# Patient Record
Sex: Female | Born: 1970 | Race: White | Hispanic: No | Marital: Married | Smoking: Never smoker
Health system: Southern US, Community
[De-identification: ages and names within clinical notes are randomized; demographics above are authoritative.]

## PROBLEM LIST (undated history)

## (undated) DIAGNOSIS — M25569 Pain in unspecified knee: Secondary | ICD-10-CM

## (undated) DIAGNOSIS — G8929 Other chronic pain: Secondary | ICD-10-CM

## (undated) DIAGNOSIS — R030 Elevated blood-pressure reading, without diagnosis of hypertension: Secondary | ICD-10-CM

## (undated) DIAGNOSIS — F419 Anxiety disorder, unspecified: Secondary | ICD-10-CM

## (undated) DIAGNOSIS — R9431 Abnormal electrocardiogram [ECG] [EKG]: Secondary | ICD-10-CM

## (undated) DIAGNOSIS — E7800 Pure hypercholesterolemia, unspecified: Secondary | ICD-10-CM

## (undated) DIAGNOSIS — I10 Essential (primary) hypertension: Secondary | ICD-10-CM

## (undated) DIAGNOSIS — R0600 Dyspnea, unspecified: Secondary | ICD-10-CM

## (undated) HISTORY — DX: Pure hypercholesterolemia, unspecified: E78.00

## (undated) HISTORY — DX: Elevated blood-pressure reading, without diagnosis of hypertension: R03.0

## (undated) HISTORY — DX: Abnormal electrocardiogram (ECG) (EKG): R94.31

## (undated) HISTORY — DX: Anxiety disorder, unspecified: F41.9

## (undated) HISTORY — PX: SPINAL CORD STIMULATOR IMPLANT: SHX2422

## (undated) HISTORY — DX: Dyspnea, unspecified: R06.00

## (undated) HISTORY — DX: Other chronic pain: G89.29

## (undated) HISTORY — DX: Pain in unspecified knee: M25.569

---

## 1997-09-16 ENCOUNTER — Inpatient Hospital Stay (HOSPITAL_COMMUNITY): Admission: RE | Admit: 1997-09-16 | Discharge: 1997-09-17 | Payer: Self-pay | Admitting: Obstetrics and Gynecology

## 1998-04-07 ENCOUNTER — Other Ambulatory Visit: Admission: RE | Admit: 1998-04-07 | Discharge: 1998-04-07 | Payer: Self-pay | Admitting: Obstetrics and Gynecology

## 1999-12-25 ENCOUNTER — Ambulatory Visit (HOSPITAL_COMMUNITY): Admission: RE | Admit: 1999-12-25 | Discharge: 1999-12-25 | Payer: Self-pay | Admitting: Orthopedic Surgery

## 1999-12-25 ENCOUNTER — Encounter: Payer: Self-pay | Admitting: Orthopedic Surgery

## 2000-01-08 ENCOUNTER — Ambulatory Visit (HOSPITAL_COMMUNITY): Admission: RE | Admit: 2000-01-08 | Discharge: 2000-01-08 | Payer: Self-pay | Admitting: Orthopedic Surgery

## 2000-01-08 ENCOUNTER — Encounter: Payer: Self-pay | Admitting: Orthopedic Surgery

## 2000-02-02 ENCOUNTER — Encounter: Payer: Self-pay | Admitting: Orthopedic Surgery

## 2000-02-02 ENCOUNTER — Ambulatory Visit (HOSPITAL_COMMUNITY): Admission: RE | Admit: 2000-02-02 | Discharge: 2000-02-02 | Payer: Self-pay | Admitting: Orthopedic Surgery

## 2000-04-15 ENCOUNTER — Other Ambulatory Visit: Admission: RE | Admit: 2000-04-15 | Discharge: 2000-04-15 | Payer: Self-pay | Admitting: Obstetrics and Gynecology

## 2003-09-13 ENCOUNTER — Other Ambulatory Visit: Admission: RE | Admit: 2003-09-13 | Discharge: 2003-09-13 | Payer: Self-pay | Admitting: Obstetrics and Gynecology

## 2004-11-20 ENCOUNTER — Other Ambulatory Visit: Admission: RE | Admit: 2004-11-20 | Discharge: 2004-11-20 | Payer: Self-pay | Admitting: Obstetrics and Gynecology

## 2005-12-06 ENCOUNTER — Ambulatory Visit: Payer: Self-pay | Admitting: Endocrinology

## 2006-01-10 ENCOUNTER — Ambulatory Visit: Payer: Self-pay | Admitting: Endocrinology

## 2006-05-06 ENCOUNTER — Ambulatory Visit: Payer: Self-pay | Admitting: "Endocrinology

## 2007-02-19 ENCOUNTER — Encounter: Payer: Self-pay | Admitting: Endocrinology

## 2007-02-19 DIAGNOSIS — F411 Generalized anxiety disorder: Secondary | ICD-10-CM | POA: Insufficient documentation

## 2007-02-20 ENCOUNTER — Ambulatory Visit: Payer: Self-pay | Admitting: Endocrinology

## 2007-10-22 ENCOUNTER — Ambulatory Visit: Payer: Self-pay | Admitting: Cardiovascular Disease

## 2007-11-04 ENCOUNTER — Ambulatory Visit: Payer: Self-pay

## 2007-11-04 ENCOUNTER — Encounter: Payer: Self-pay | Admitting: Cardiovascular Disease

## 2009-02-18 ENCOUNTER — Emergency Department (HOSPITAL_BASED_OUTPATIENT_CLINIC_OR_DEPARTMENT_OTHER): Admission: EM | Admit: 2009-02-18 | Discharge: 2009-02-18 | Payer: Self-pay | Admitting: Emergency Medicine

## 2009-02-18 ENCOUNTER — Ambulatory Visit: Payer: Self-pay | Admitting: Diagnostic Radiology

## 2010-04-10 ENCOUNTER — Encounter: Admission: RE | Admit: 2010-04-10 | Discharge: 2010-04-10 | Payer: Self-pay | Admitting: Family Medicine

## 2010-08-31 IMAGING — CR DG PELVIS 1-2V
1 series · 1 of 1 positions shown · non-contrast
Comparison: Lumbar spine radiographs obtained at the same time.

CLINICAL DATA: Left lower back pain following a fall today.

PELVIS - 1-2 VIEW

[t pelvis a.p.]
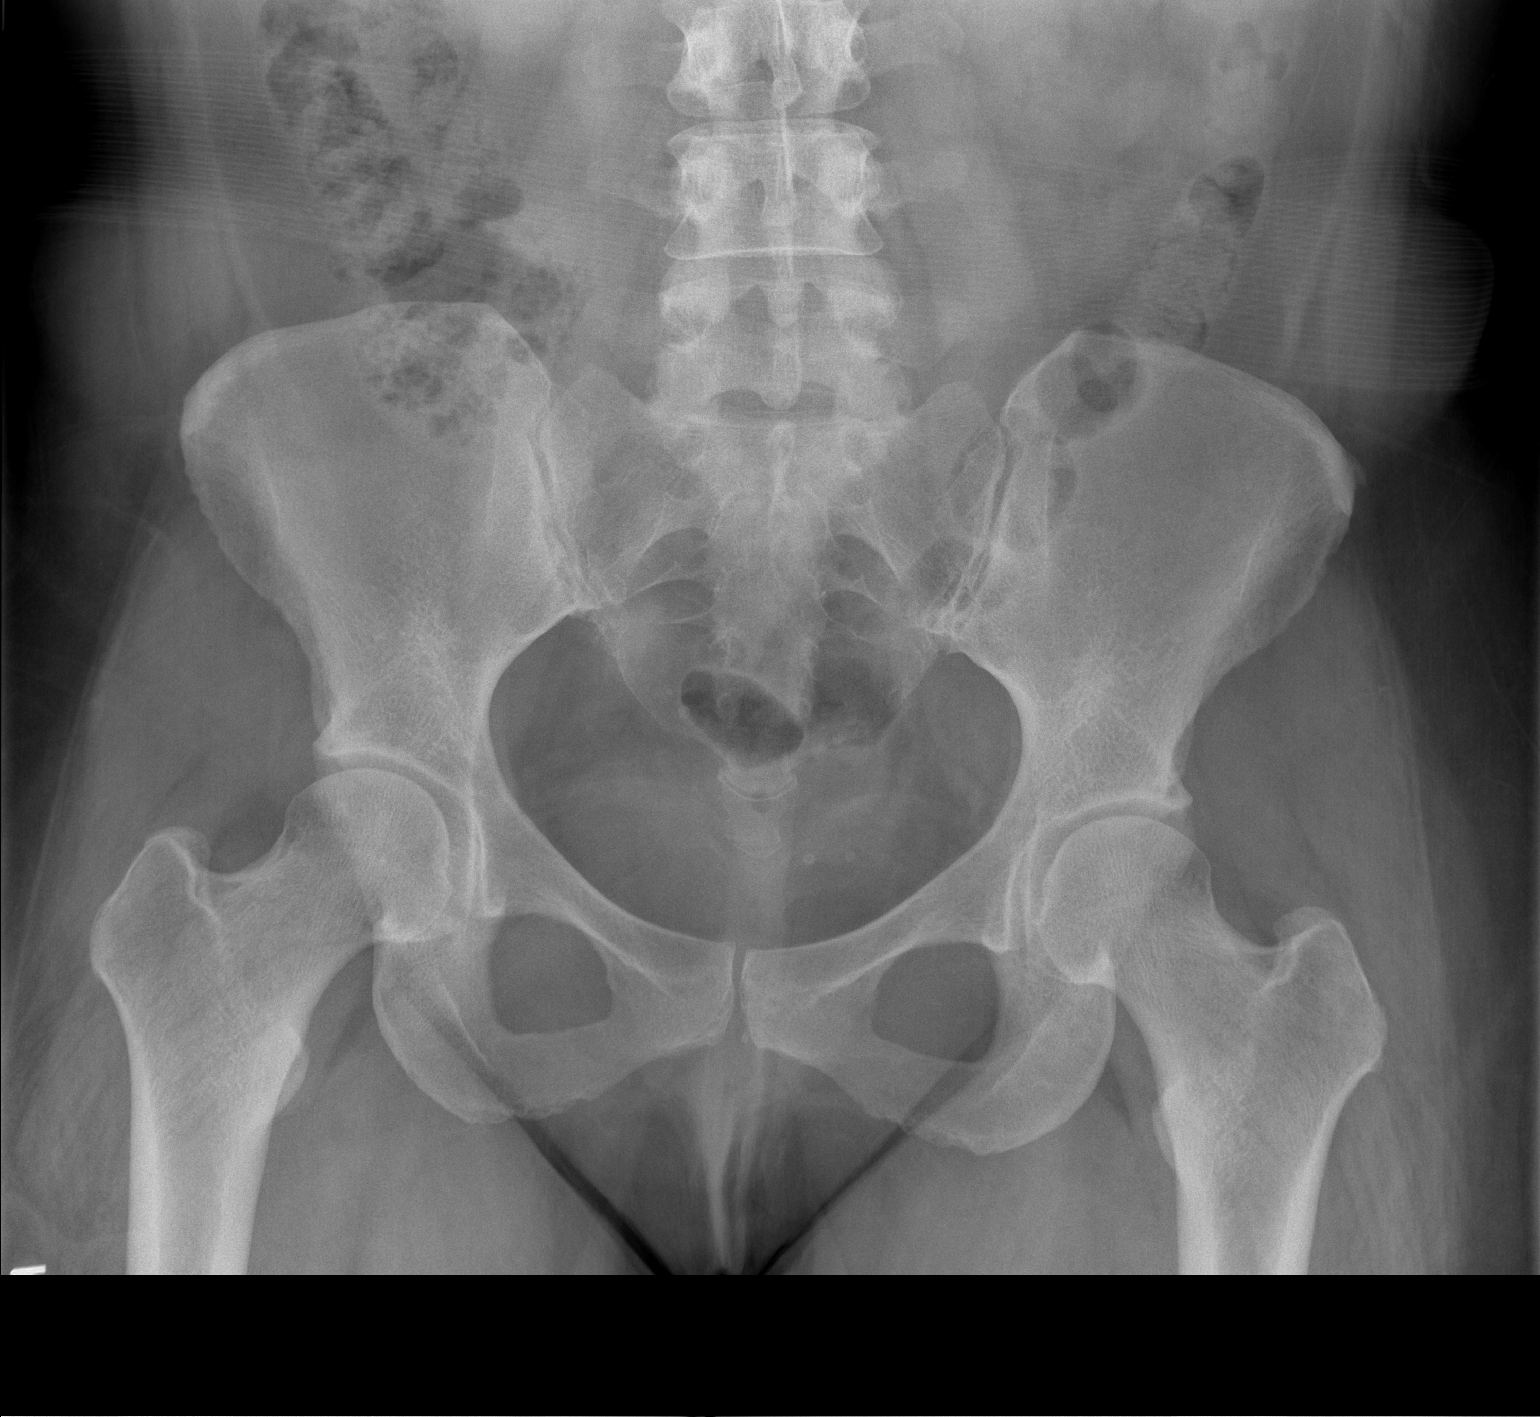

[1 of 1 positions shown; findings below may reference images not displayed]

FINDINGS: Normal appearing bones and soft tissues without fracture
or dislocation.  Two small left inferior pelvic phleboliths.
IMPRESSION: No fracture or dislocation.

## 2010-11-28 NOTE — Assessment & Plan Note (Signed)
[facility]                            CARDIOLOGY OFFICE NOTE   SHYAH, CADMUS                      MRN:          [id number]  DATE:10/22/2007                            DOB:          12-Feb-1971    Ms. Curfman is a delightful 40 year old patient referred by Dr. Collins Scotland  for dyspnea and abnormal EKG with borderline high blood pressure.   Ms. Skilton has no previous history of cardiopulmonary disease.  She  has had some allergies which are a little bit worse this time a year.  She has seen Dr. Collins Scotland recently.  The patient had low voltage on her  EKG.  There was a question of lateral T-wave inversions.  I looked at  her EKG and was not particularly impressed with the changes.  The  patient has gained 80 pounds in the last 2 years.  She is rather large-  breasted, and I suspect any EKG changes have to do with this weight  gain.   In talking to the patient, she has had dyspnea for 6 months.  There has  been no associated fever or sputum.  She has occasional cough.  There  has been no PND or orthopnea.  She has had some dependent edema.  There  is no history of PE or DVT.   The patient has not had a previous cardiac workup.   She has hyperlipidemia, borderline hypertension.  She is a nonsmoker.   FAMILY HISTORY:  Negative coronary artery disease and she is a  nondiabetic.   She does have hypercholesterolemia on therapy.   The patient does seem a little bit depressed.  She was fairly emotional  in our encounter.  She was recently started on Wellbutrin by Dr. Kerri Perches PA, Vincenza Hews, and I think this is appropriate.   PAST MEDICAL HISTORY:  Remarkable for tummy tuck, shoulder scope, and  hysterectomy.  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  She is married.  She  has 2 step children and 2 children of her own, all of them are  teenagers.  She is allergic to PENICILLIN.  She is on  Wellbutrin 100 mg,  cephradine, Xanax, hydrochlorothiazide 25 a day, Crestor 20 a day.  The  patient's family history is negative.  Mother and father alive at ages  55 and 70.   EXAM:  Remarkable for an overweight white female in no distress.  Blood pressure is 140/80, pulse is 80 and regular, afebrile, respiratory  rate 14.  HEENT:  Unremarkable.  Carotids are normal without bruit, no lymphadenopathy, thyromegaly, JVP  elevation.  LUNGS:  Clear diaphragmatic motion.  No wheezing.  S1-S2 distant heart sounds.  PMI not palpable.  ABDOMEN:  Protuberant.  Bowel sounds positive.  No hepatosplenomegaly.  No hepatojugular reflux.  No bruit, no tenderness.  Distal pulses intact with trace edema.  NEURO:  Nonfocal.  SKIN:  Warm and dry.  No muscular weakness.   Notes are reviewed from [facility].  Lab work was remarkable for  potassium 3.7, BUN 14,  creatinine 0.8.  EKG is reviewed.   IMPRESSION:  1. Abnormal EKG with low voltage rule out pericardial effusion.  Check      2-D echocardiogram.  2. Dyspnea, likely functional due to weight gain.  Check 2-D      echocardiogram to rule out cardiomyopathy.  Stress Myoview to      assess exercise capacity.  Rule out chronotropic incompetence of      coronary disease.  3. Weight gain.  Follow up with Dr. Collins Scotland.  Needs to go back on an      exercise program and see a nutritionist.  In talking to the      patient, she has complaints of fatigue and malaise.  She would      appear to have depression and I think Wellbutrin is appropriate for      her.  She will follow up with Dr. Collins Scotland for further therapy of this      which may be the root cause of her problems.   Further recommendations based on results of her stress test and echo,  but I suspect she will be able to be seen on a p.r.n. basis.     Noralyn Pick. Eden Emms, MD, [facility]  Electronically Signed    PCN/MedQ  DD: 10/22/2007  DT: 10/22/2007  Job #: [id number]

## 2011-07-16 ENCOUNTER — Encounter: Payer: Self-pay | Admitting: Cardiovascular Disease

## 2011-07-18 ENCOUNTER — Ambulatory Visit (INDEPENDENT_AMBULATORY_CARE_PROVIDER_SITE_OTHER): Payer: BC Managed Care – PPO | Admitting: Cardiovascular Disease

## 2011-07-18 ENCOUNTER — Encounter: Payer: Self-pay | Admitting: Cardiovascular Disease

## 2011-07-18 DIAGNOSIS — E669 Obesity, unspecified: Secondary | ICD-10-CM

## 2011-07-18 DIAGNOSIS — R634 Abnormal weight loss: Secondary | ICD-10-CM

## 2011-07-18 DIAGNOSIS — F411 Generalized anxiety disorder: Secondary | ICD-10-CM

## 2011-07-18 DIAGNOSIS — R079 Chest pain, unspecified: Secondary | ICD-10-CM | POA: Insufficient documentation

## 2011-07-18 DIAGNOSIS — R9431 Abnormal electrocardiogram [ECG] [EKG]: Secondary | ICD-10-CM

## 2011-07-18 NOTE — Assessment & Plan Note (Signed)
Discussed low carb diet.  If she passes her stress test will start exercising.  Otherwise would be a candidate for bariatric surgery

## 2011-07-18 NOTE — Assessment & Plan Note (Signed)
Stable F/U Dr Cyndia Bent

## 2011-07-18 NOTE — Patient Instructions (Signed)
Continue current medications as listed.  Your physician has requested that you have a stress echocardiogram. For further information please visit [email or web address] Please follow instruction sheet as given.  You have been referred for a nutritional consult.  Follow up in 1 year with Dr Eden Emms.  You will receive a letter in the mail 2 months before you are due.  Please call us when you receive this letter to schedule your follow up appointment.

## 2011-07-18 NOTE — Progress Notes (Signed)
Referred by DR Cyndia Bent.  Previously had Dr Collins Scotland as primary.  She sent her to see [place] in 2009 for abnormal ECG and dyspnea.  Echo.  Reviewed echo from 11/04/2007 and normal with EF 60%  Continues be overweight, sedentary and poor eating habits.  Has had SSCP or tightness in chest when she is stressed.  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  Has 4 kids age 41-24 at home.  Tightness in chest radiates to right arm  ROS: Denies fever, malais, weight loss, blurry vision, decreased visual acuity, cough, sputum, SOB, hemoptysis, pleuritic pain, palpitaitons, heartburn, abdominal pain, melena, lower extremity edema, claudication, or rash.  All other systems reviewed and negative   General: Affect appropriate Obese HEENT: normal Neck supple with no adenopathy JVP normal no bruits no thyromegaly Lungs clear with no wheezing and good diaphragmatic motion Heart:  S1/S2 no murmur,rub, gallop or click PMI normal Abdomen: benighn, BS positve, no tenderness, no AAA no bruit.  No HSM or HJR Distal pulses intact with no bruits No edema Neuro non-focal Skin warm and dry No muscular weakness  Medications Current Outpatient Prescriptions  Medication Sig Dispense Refill  . ALPRAZolam (XANAX) 0.5 MG tablet Take 0.5 mg by mouth at bedtime as needed.        . DULoxetine (CYMBALTA) 60 MG capsule Take 60 mg by mouth daily.          Allergies Penicillins  Family History: No family history on file.  Social History: History   Social History  . Marital Status: Married    Spouse Name: N/A    Number of Children: N/A  . Years of Education: N/A   Occupational History  . Not on file.   Social History Main Topics  . Smoking status: Never Smoker   . Smokeless tobacco: Never Used  . Alcohol Use: Yes  . Drug Use: No  . Sexually Active: Not on file   Other Topics Concern  . Not on file   Social History Narrative  . No narrative on file    Electrocardiogram:  NSR low  voltage from obesity. Q in lead 3  Assessment and Plan

## 2011-07-18 NOTE — Assessment & Plan Note (Signed)
No change from 2009  When she had a normal echo.  Likely form body habitus

## 2011-07-18 NOTE — Assessment & Plan Note (Signed)
Atypical but obese and needs to start exercise program.  Stress echo

## 2011-07-19 ENCOUNTER — Telehealth: Payer: Self-pay | Admitting: Cardiovascular Disease

## 2011-07-19 NOTE — Telephone Encounter (Signed)
New msg Pt was returning your call please call at this number

## 2011-07-19 NOTE — Telephone Encounter (Signed)
SHARON CALLED PT RE MAKING APPT WITH DIETICIAN CALL TRASFERRED .Zack Seal

## 2011-07-23 ENCOUNTER — Telehealth: Payer: Self-pay | Admitting: Cardiovascular Disease

## 2011-07-23 NOTE — Telephone Encounter (Signed)
New msg Pt has sinus infection and congestion and wants to know if she should come to appt tomorrow. Please call

## 2011-07-23 NOTE — Telephone Encounter (Signed)
SPOKE WITH PT HAS CONCERNS  RE PROCEEDING WITH STRESS ECHO  IS COUGHING A LOT WHEN GETS HOT  ALSO NOT FEELING  WELL, INSTRUCTED PT TO RESCHEDULE  TEST. PT VERBALIZED UNDERSTANDING .Zack Seal

## 2011-07-25 ENCOUNTER — Other Ambulatory Visit (HOSPITAL_COMMUNITY): Payer: BC Managed Care – PPO | Admitting: Radiology

## 2011-08-01 ENCOUNTER — Ambulatory Visit (HOSPITAL_COMMUNITY): Payer: BC Managed Care – PPO | Attending: Cardiovascular Disease | Admitting: Radiology

## 2011-08-01 ENCOUNTER — Other Ambulatory Visit (HOSPITAL_COMMUNITY): Payer: Self-pay | Admitting: Cardiology

## 2011-08-01 DIAGNOSIS — R072 Precordial pain: Secondary | ICD-10-CM

## 2011-08-01 DIAGNOSIS — E785 Hyperlipidemia, unspecified: Secondary | ICD-10-CM | POA: Insufficient documentation

## 2011-08-01 DIAGNOSIS — R0989 Other specified symptoms and signs involving the circulatory and respiratory systems: Secondary | ICD-10-CM

## 2011-08-01 DIAGNOSIS — Z8249 Family history of ischemic heart disease and other diseases of the circulatory system: Secondary | ICD-10-CM | POA: Insufficient documentation

## 2011-08-01 DIAGNOSIS — I10 Essential (primary) hypertension: Secondary | ICD-10-CM | POA: Insufficient documentation

## 2011-08-01 DIAGNOSIS — R079 Chest pain, unspecified: Secondary | ICD-10-CM | POA: Insufficient documentation

## 2011-08-01 MED ORDER — PERFLUTREN PROTEIN A MICROSPH IV SUSP
3.0000 mL | Freq: Once | INTRAVENOUS | Status: AC
  Administered 2011-08-01: 3 mL via INTRAVENOUS

## 2011-08-03 ENCOUNTER — Telehealth: Payer: Self-pay | Admitting: *Deleted

## 2011-08-03 DIAGNOSIS — I10 Essential (primary) hypertension: Secondary | ICD-10-CM

## 2011-08-03 NOTE — Telephone Encounter (Signed)
Pt aware of stress test results and questioned if dr Eden Emms was planning on putting her on a bp med. Will forward for dr Eden Emms review

## 2011-08-09 NOTE — Telephone Encounter (Signed)
Yes  Cozaar 50mg  and F/U BMET in 8 weeks

## 2011-08-10 MED ORDER — LOSARTAN POTASSIUM 50 MG PO TABS: 50.0000 mg | ORAL_TABLET | Freq: Every day | ORAL | Status: AC

## 2011-08-10 NOTE — Telephone Encounter (Signed)
Spoke with pt, aware of new med and need for repeat labs

## 2011-10-02 ENCOUNTER — Other Ambulatory Visit: Payer: BC Managed Care – PPO

## 2012-03-11 ENCOUNTER — Other Ambulatory Visit: Payer: Self-pay | Admitting: Family Medicine

## 2012-03-11 DIAGNOSIS — Z1231 Encounter for screening mammogram for malignant neoplasm of breast: Secondary | ICD-10-CM

## 2012-03-21 ENCOUNTER — Ambulatory Visit: Payer: BC Managed Care – PPO

## 2012-05-12 ENCOUNTER — Ambulatory Visit
Admission: RE | Admit: 2012-05-12 | Discharge: 2012-05-12 | Disposition: A | Discharge status: Home / Self Care | Payer: BC Managed Care – PPO | Source: Ambulatory Visit | Attending: Family Medicine | Admitting: Family Medicine

## 2012-05-12 DIAGNOSIS — Z1231 Encounter for screening mammogram for malignant neoplasm of breast: Secondary | ICD-10-CM

## 2013-09-28 ENCOUNTER — Other Ambulatory Visit: Payer: Self-pay

## 2013-09-28 DIAGNOSIS — Z1231 Encounter for screening mammogram for malignant neoplasm of breast: Secondary | ICD-10-CM

## 2013-10-12 ENCOUNTER — Ambulatory Visit
Admission: RE | Admit: 2013-10-12 | Discharge: 2013-10-12 | Disposition: A | Discharge status: Home / Self Care | Payer: BC Managed Care – PPO | Source: Ambulatory Visit

## 2013-10-12 DIAGNOSIS — Z1231 Encounter for screening mammogram for malignant neoplasm of breast: Secondary | ICD-10-CM

## 2015-05-03 ENCOUNTER — Other Ambulatory Visit: Payer: Self-pay

## 2015-05-03 DIAGNOSIS — Z1231 Encounter for screening mammogram for malignant neoplasm of breast: Secondary | ICD-10-CM

## 2015-05-19 ENCOUNTER — Ambulatory Visit
Admission: RE | Admit: 2015-05-19 | Discharge: 2015-05-19 | Disposition: A | Discharge status: Home / Self Care | Payer: BC Managed Care – PPO | Source: Ambulatory Visit

## 2015-05-19 DIAGNOSIS — Z1231 Encounter for screening mammogram for malignant neoplasm of breast: Secondary | ICD-10-CM

## 2016-07-24 ENCOUNTER — Other Ambulatory Visit: Payer: Self-pay | Admitting: Family Medicine

## 2016-07-24 DIAGNOSIS — Z1231 Encounter for screening mammogram for malignant neoplasm of breast: Secondary | ICD-10-CM

## 2016-08-16 ENCOUNTER — Ambulatory Visit
Admission: RE | Admit: 2016-08-16 | Discharge: 2016-08-16 | Disposition: A | Discharge status: Home / Self Care | Payer: BC Managed Care – PPO | Source: Ambulatory Visit | Attending: Family Medicine | Admitting: Family Medicine

## 2016-08-16 DIAGNOSIS — Z1231 Encounter for screening mammogram for malignant neoplasm of breast: Secondary | ICD-10-CM

## 2018-02-26 IMAGING — MG 2D DIGITAL SCREENING BILATERAL MAMMOGRAM WITH CAD AND ADJUNCT TO
8 of 12 series · 8 of 28 positions shown · non-contrast
Comparison: Previous exam(s).

CLINICAL DATA: Screening.

EXAM:
2D DIGITAL SCREENING BILATERAL MAMMOGRAM WITH CAD AND ADJUNCT TOMO

[L MLO]
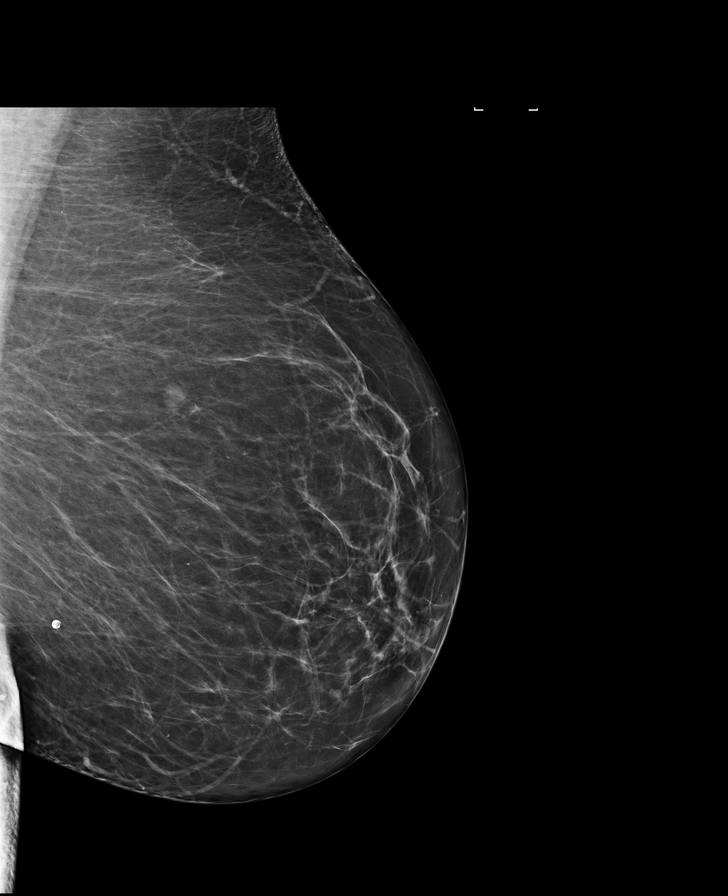

[L CC]
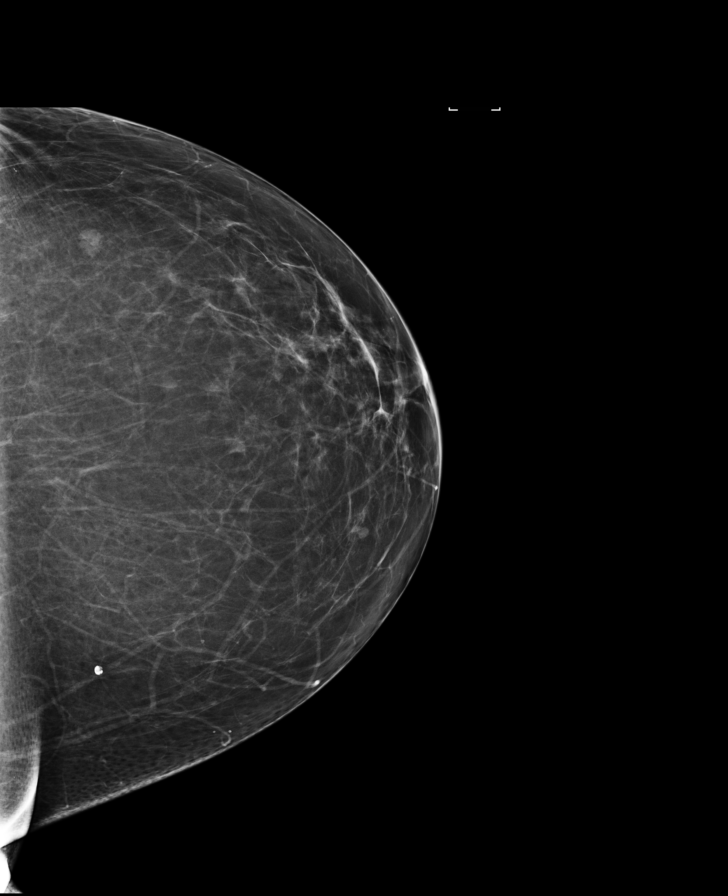

[R MLO synth-2D]
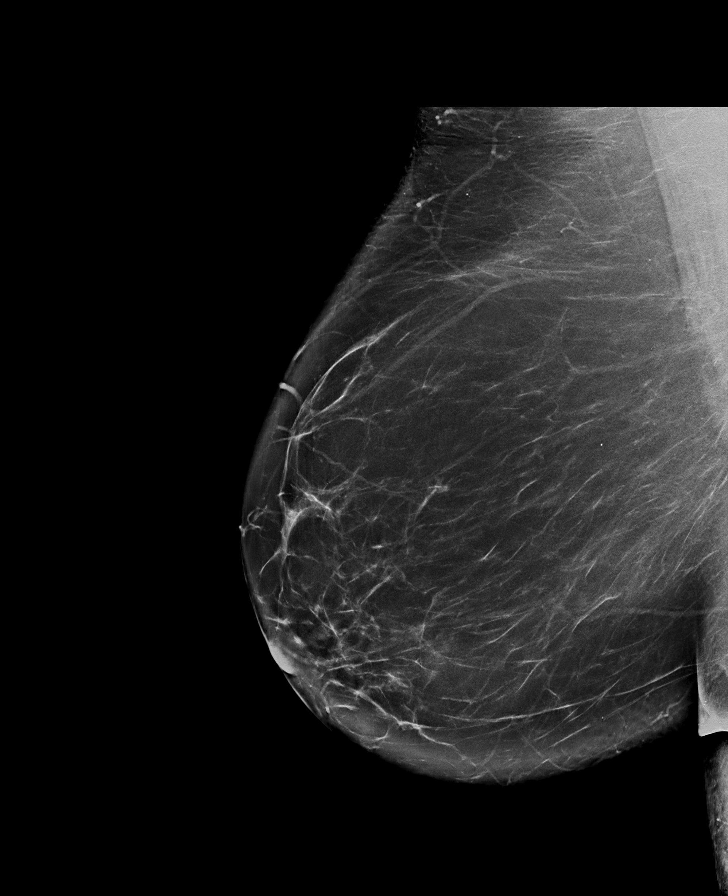

[R CC]
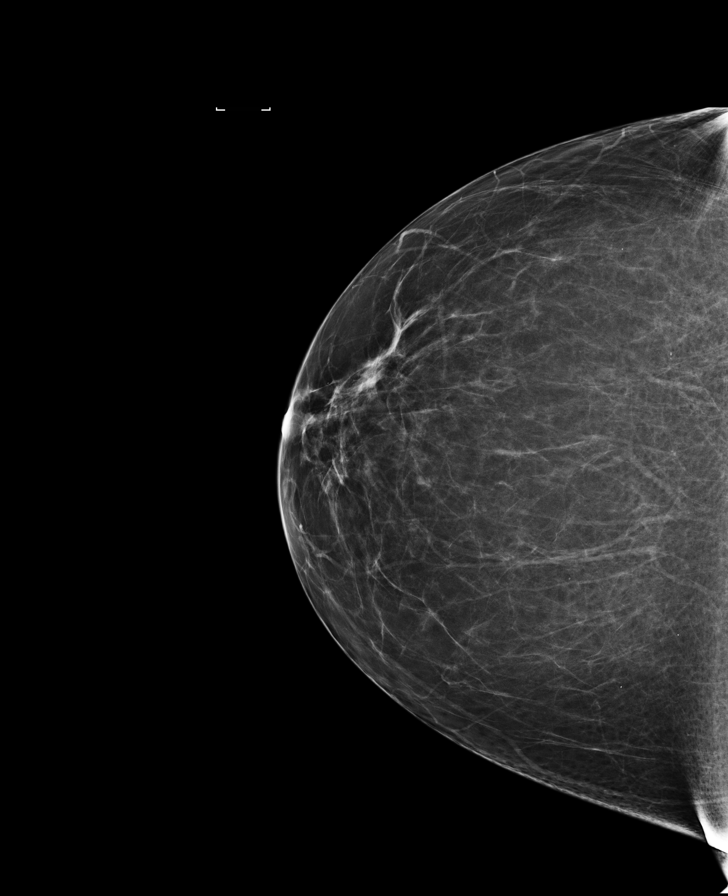

[L MLO synth-2D]
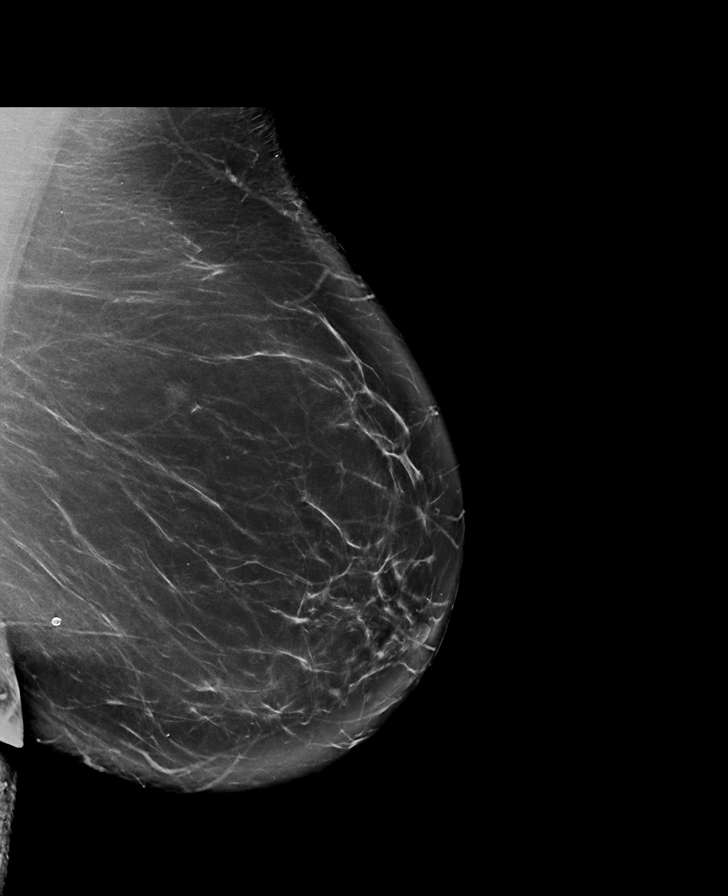

[L CC synth-2D]
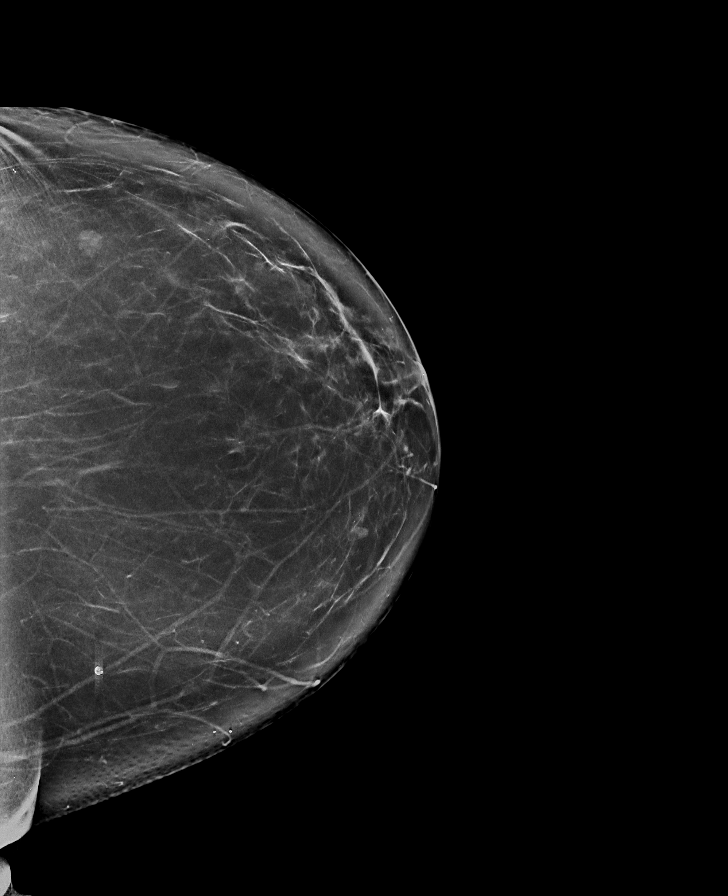

[R MLO]
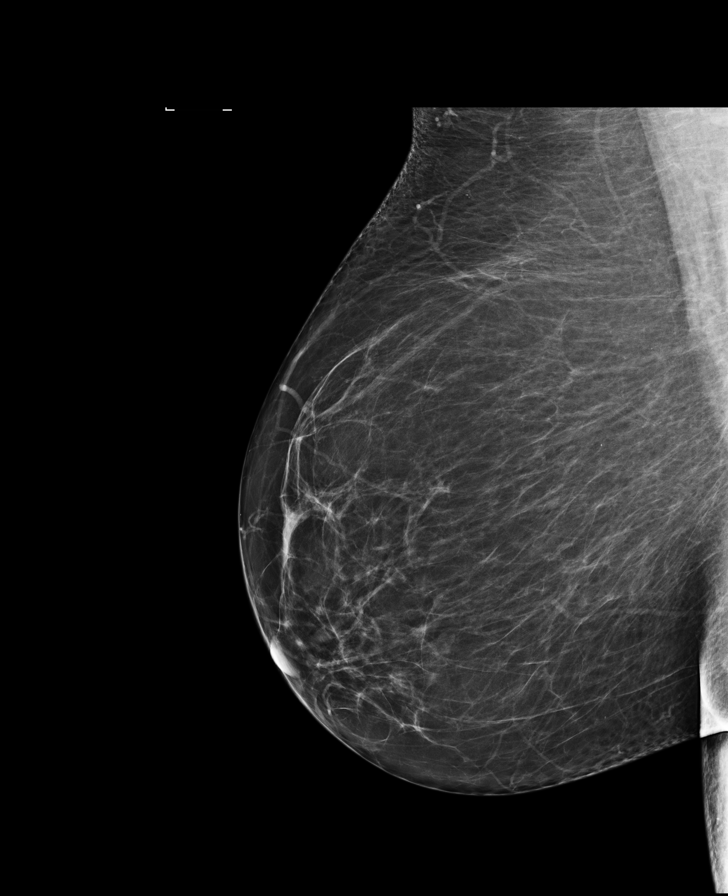

[R CC synth-2D]
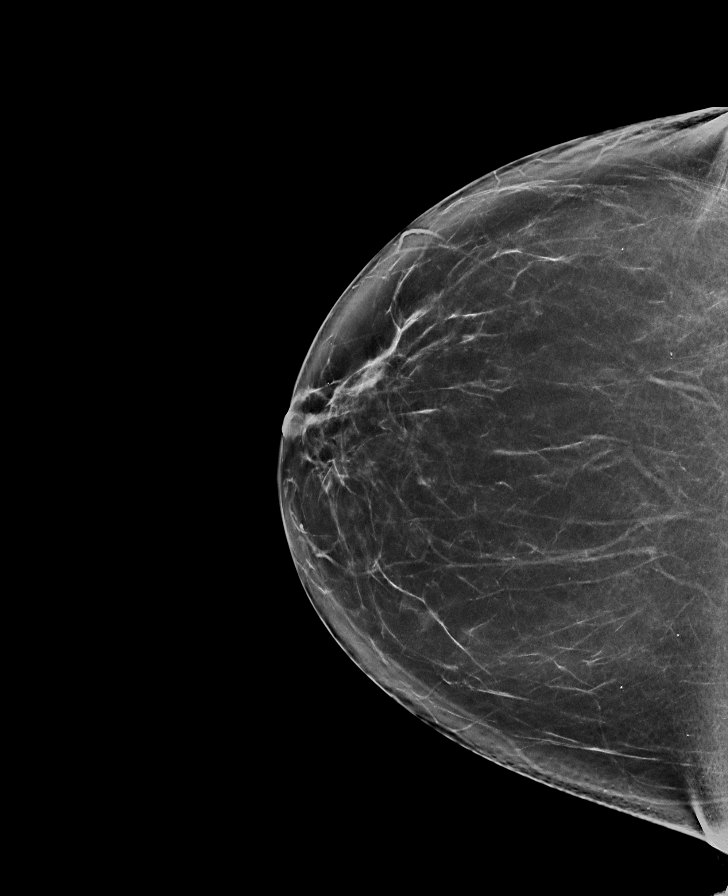

[8 of 28 positions shown; findings below may reference images not displayed]

ACR Breast Density Category b: There are scattered areas of
fibroglandular density.
FINDINGS: There are no findings suspicious for malignancy. Images were
processed with CAD.
IMPRESSION: No mammographic evidence of malignancy. A result letter of this
screening mammogram will be mailed directly to the patient.

RECOMMENDATION:
Screening mammogram in one year. (Code:97-6-RS4)

BI-RADS CATEGORY  1: Negative.

## 2018-04-24 HISTORY — PX: LUMBAR FUSION: SHX111

## 2018-05-16 HISTORY — PX: CERVICAL DISCECTOMY: SHX98

## 2021-08-23 ENCOUNTER — Encounter (HOSPITAL_BASED_OUTPATIENT_CLINIC_OR_DEPARTMENT_OTHER): Payer: Self-pay | Admitting: Emergency Medicine

## 2021-08-23 ENCOUNTER — Other Ambulatory Visit: Payer: Self-pay

## 2021-08-23 ENCOUNTER — Emergency Department (HOSPITAL_BASED_OUTPATIENT_CLINIC_OR_DEPARTMENT_OTHER)
Admission: EM | Admit: 2021-08-23 | Discharge: 2021-08-23 | Disposition: A | Discharge status: Home / Self Care | Payer: No Typology Code available for payment source | Attending: Emergency Medicine | Admitting: Emergency Medicine

## 2021-08-23 DIAGNOSIS — Z23 Encounter for immunization: Secondary | ICD-10-CM | POA: Insufficient documentation

## 2021-08-23 DIAGNOSIS — Z79899 Other long term (current) drug therapy: Secondary | ICD-10-CM | POA: Insufficient documentation

## 2021-08-23 DIAGNOSIS — S51851A Open bite of right forearm, initial encounter: Secondary | ICD-10-CM | POA: Insufficient documentation

## 2021-08-23 DIAGNOSIS — W540XXA Bitten by dog, initial encounter: Secondary | ICD-10-CM | POA: Diagnosis not present

## 2021-08-23 DIAGNOSIS — S61052A Open bite of left thumb without damage to nail, initial encounter: Secondary | ICD-10-CM | POA: Diagnosis not present

## 2021-08-23 MED ORDER — HYDROCODONE-ACETAMINOPHEN 5-325 MG PO TABS: 1.0000 | ORAL_TABLET | Freq: Four times a day (QID) | ORAL | 0 refills | Status: AC | PRN

## 2021-08-23 MED ORDER — DOXYCYCLINE HYCLATE 100 MG PO TABS
100.0000 mg | ORAL_TABLET | Freq: Once | ORAL | Status: AC
  Administered 2021-08-23: 100 mg via ORAL
  Filled 2021-08-23: qty 1

## 2021-08-23 MED ORDER — DOXYCYCLINE HYCLATE 100 MG PO CAPS
100.0000 mg | ORAL_CAPSULE | Freq: Two times a day (BID) | ORAL | 0 refills | Status: AC
Start: 2021-08-23 — End: ?

## 2021-08-23 MED ORDER — TETANUS-DIPHTH-ACELL PERTUSSIS 5-2.5-18.5 LF-MCG/0.5 IM SUSY
0.5000 mL | PREFILLED_SYRINGE | Freq: Once | INTRAMUSCULAR | Status: AC
Start: 2021-08-23 — End: 2021-08-23
  Administered 2021-08-23: 0.5 mL via INTRAMUSCULAR
  Filled 2021-08-23: qty 0.5

## 2021-08-23 MED ORDER — METRONIDAZOLE 500 MG PO TABS
500.0000 mg | ORAL_TABLET | Freq: Once | ORAL | Status: AC
  Administered 2021-08-23: 500 mg via ORAL
  Filled 2021-08-23: qty 1

## 2021-08-23 MED ORDER — LIDOCAINE-EPINEPHRINE 2 %-1:200000 IJ SOLN
20.0000 mL | Freq: Once | INTRAMUSCULAR | Status: AC
Start: 2021-08-23 — End: 2021-08-23
  Administered 2021-08-23: 20 mL via INTRADERMAL
  Filled 2021-08-23: qty 20

## 2021-08-23 MED ORDER — METRONIDAZOLE 500 MG PO TABS: 500.0000 mg | ORAL_TABLET | Freq: Two times a day (BID) | ORAL | 0 refills | Status: AC

## 2021-08-23 MED ORDER — OXYCODONE-ACETAMINOPHEN 5-325 MG PO TABS
1.0000 | ORAL_TABLET | Freq: Once | ORAL | Status: AC
  Administered 2021-08-23: 1 via ORAL
  Filled 2021-08-23: qty 1

## 2021-08-23 NOTE — Discharge Instructions (Addendum)
You have been evaluated for most recent dog bite injuries.  Please follow instruction below for wound care.  Take antibiotic as prescribed.  Reach out to your surgeon to determine whether you would benefit from rabies series.  Call and follow-up closely with hand specialist for further care.  Return if you have any concerns

## 2021-08-23 NOTE — ED Provider Notes (Signed)
[facility] EMERGENCY DEPT Provider Note   CSN: [id number] Arrival date & time: 08/23/21  1124     History  Chief Complaint  Patient presents with   Animal Bite    Robin Lawson is a 51 y.o. female.  The history is provided by the patient. No language interpreter was used.  Animal Bite  51 year old female with significant history of anxiety, who presents evaluation of dog bite.  Patient reports earlier today she was dog sitting her dog, and her son's dog.  She mention her son's dog kicked her dog and she attempted to separate the dog bite in the process she fell in between both dog and was bitten in the right forearm and the left thumb.  States that the dog bite in her left thumb was from her dog, and the dog bite in the right forearm was from her son's dog.  She is unsure her last tetanus status.  Her son's dog is not up-to-date with rabies.  She is currently complaining of sharp throbbing pain about the forearm and rates as 5 out of 10 without any associated numbness.  She also is scheduled to have a gastric bypass in 2 weeks.  She denies any specific treatment tried prior to arrival.  Home Medications Prior to Admission medications   Medication Sig Start Date End Date Taking? Authorizing Provider  ALPRAZolam Prudy Feeler) 0.5 MG tablet Take 0.5 mg by mouth at bedtime as needed.      [provider]  DULoxetine (CYMBALTA) 60 MG capsule Take 60 mg by mouth daily.      [provider]  losartan (COZAAR) 50 MG tablet Take 1 tablet (50 mg total) by mouth daily. 08/10/11 08/09/12  Wendall Stade, MD      Allergies    Penicillins    Review of Systems   Review of Systems  All other systems reviewed and are negative.  Physical Exam Updated Vital Signs BP (!) 127/110 (BP Location: Left Arm)    Pulse (!) 108    Temp 98.5 F (36.9 C)    Resp 18    Wt 109.8 kg    SpO2 99%  Physical Exam Vitals and nursing note reviewed.  Constitutional:      General: She is  not in acute distress.    Appearance: She is well-developed.  HENT:     Head: Atraumatic.  Eyes:     Conjunctiva/sclera: Conjunctivae normal.  Pulmonary:     Effort: Pulmonary effort is normal.  Musculoskeletal:        General: Signs of injury (Large skin avulsion noted to right forearm and small skin avulsion noted to left thumb.  Please refer to picture below.) present.     Cervical back: Neck supple.  Skin:    Findings: No rash.  Neurological:     Mental Status: She is alert.  Psychiatric:        Mood and Affect: Mood normal.        ED Results / Procedures / Treatments   Labs (all labs ordered are listed, but only abnormal results are displayed) Labs Reviewed - No data to display  EKG None  Radiology No results found.  Procedures .Marland KitchenLaceration Repair  Date/Time: 08/23/2021 2:39 PM Performed by: Fayrene Helper, PA-C Authorized by: Fayrene Helper, PA-C   Consent:    Consent obtained:  Verbal   Consent given by:  Patient   Risks discussed:  Infection, need for additional repair, pain, poor cosmetic result and poor wound  healing   Alternatives discussed:  No treatment and delayed treatment Universal protocol:    Procedure explained and questions answered to patient or proxy's satisfaction: yes     Relevant documents present and verified: yes     Test results available: yes     Imaging studies available: yes     Required blood products, implants, devices, and special equipment available: yes     Site/side marked: yes     Immediately prior to procedure, a time out was called: yes     Patient identity confirmed:  Verbally with patient Anesthesia:    Anesthesia method:  Local infiltration   Local anesthetic:  Lidocaine 2% WITH epi Laceration details:    Location:  Shoulder/arm   Shoulder/arm location:  R lower arm   Length (cm):  5   Depth (mm):  6 Pre-procedure details:    Preparation:  Patient was prepped and draped in usual sterile fashion Exploration:     Limited defect created (wound extended): yes     Hemostasis achieved with:  Epinephrine   Imaging outcome: foreign body not noted     Wound exploration: wound explored through full range of motion and entire depth of wound visualized     Wound extent: muscle damage     Wound extent: no underlying fracture noted and no vascular damage noted     Contaminated: no   Treatment:    Area cleansed with:  Povidone-iodine and saline   Amount of cleaning:  Extensive   Irrigation solution:  Sterile saline   Irrigation method:  Pressure wash   Visualized foreign bodies/material removed: no     Debridement:  None   Layers repaired: nonviable tissue were surgically removed using sterile scissor. Skin repair:    Repair method:  Steri-Strips (derma-clip)   Number of Steri-Strips:  5 Approximation:    Approximation:  Loose Repair type:    Repair type:  Complex Post-procedure details:    Dressing:  Non-adherent dressing   Procedure completion:  Tolerated .Marland KitchenLaceration Repair  Date/Time: 08/23/2021 2:42 PM Performed by: Fayrene Helper, PA-C Authorized by: Fayrene Helper, PA-C   Consent:    Consent obtained:  Verbal   Consent given by:  Patient   Risks discussed:  Infection, need for additional repair, pain, poor cosmetic result and poor wound healing   Alternatives discussed:  No treatment and delayed treatment Universal protocol:    Procedure explained and questions answered to patient or proxy's satisfaction: yes     Relevant documents present and verified: yes     Test results available: yes     Imaging studies available: yes     Required blood products, implants, devices, and special equipment available: yes     Site/side marked: yes     Immediately prior to procedure, a time out was called: yes     Patient identity confirmed:  Verbally with patient Anesthesia:    Anesthesia method:  Local infiltration   Local anesthetic:  Lidocaine 2% WITH epi Laceration details:    Location:  Finger    Finger location:  L thumb   Length (cm):  2   Depth (mm):  2 Exploration:    Limited defect created (wound extended): no     Hemostasis achieved with:  Epinephrine   Imaging outcome: foreign body not noted     Wound exploration: wound explored through full range of motion and entire depth of wound visualized     Contaminated: no   Treatment:    Area  cleansed with:  Povidone-iodine and saline   Amount of cleaning:  Standard   Irrigation solution:  Sterile saline   Irrigation method:  Pressure wash   Visualized foreign bodies/material removed: no     Debridement:  None   Undermining:  None   Scar revision: no   Skin repair:    Repair method:  Steri-Strips   Number of Steri-Strips:  2 Approximation:    Approximation:  Loose Repair type:    Repair type:  Simple Post-procedure details:    Dressing:  Non-adherent dressing   Procedure completion:  Tolerated    Medications Ordered in ED Medications  lidocaine-EPINEPHrine (XYLOCAINE W/EPI) 2 %-1:200000 (PF) injection 20 mL (has no administration in time range)  Tdap (BOOSTRIX) injection 0.5 mL (0.5 mLs Intramuscular Given 08/23/21 1324)  oxyCODONE-acetaminophen (PERCOCET/ROXICET) 5-325 MG per tablet 1 tablet (1 tablet Oral Given 08/23/21 1323)  doxycycline (VIBRA-TABS) tablet 100 mg (100 mg Oral Given 08/23/21 1324)  metroNIDAZOLE (FLAGYL) tablet 500 mg (500 mg Oral Given 08/23/21 1324)    ED Course/ Medical Decision Making/ A&P                           Medical Decision Making Risk Prescription drug management.   BP (!) 127/110 (BP Location: Left Arm)    Pulse (!) 108    Temp 98.5 F (36.9 C)    Resp 18    Wt 109.8 kg    SpO2 99%   1:20 PM This is a 51 year old female presenting for evaluation of dog bite.  Patient was in the process of separating 2 dogs from fighting earlier today when she was bitten in the right forearm by her son's dog, as well as bitten in the left thumb by her dog.  She has a large skin avulsion noted to the  right forearm as pictured above.  There is adipose tissue exposed and nonviable skin flap noted.  No evidence of bony injury or nerve or tendon injury on initial exam.  Small skin avulsion noted to left thumb at the proximal phalanx medially without any bony, joint, tendon, or nerve involvement.  I did consult on-call hand specialist PA, Earney Hamburg, who recommend thorough irrigations of the wound, initial antibiotic, loosely sutured and patient can follow-up with hand specialist, Dr. Orlan Leavens, next week.  Based on history it appears this is likely to be a provoked bite.  We did discussed options of rabies series but at this time, patient prefers for animal control to monitor appendix, she will also consult with her surgeon who will be performing a gastric bypass in 2 weeks if Rabies series would be appropriate for her at this time.  She understand the risks.  I will also update her tetanus  We will provide patient with antibiotics which include doxycycline and Flagyl since patient is allergic to penicillin and therefore Augmentin is not an option.  Care discussed with DR. Horton.   2:44 PM Laceration repair was performed by me.  First a anesthetized the wound, then I thoroughly irrigate the laceration area, using sterile scissors I trimmed off devitalized tissues.  Subsequently I used derma clip to bring the wound loosely together.  Patient will call follow-up closely with hand specialist for outpatient care.  Will prescribe pain medication and antibiotic and will provide appropriate wound care instruction.  At this time have low suspicion for bony fracture, tendon injury, or nerve injury.  I have considered advanced imaging such as x-ray  but felt it is low yield.  Patient agrees.  Tetanus has been updated.        Final Clinical Impression(s) / ED Diagnoses Final diagnoses:  Dog bite of right forearm, initial encounter  Dog bite of left thumb, initial encounter    Rx / DC Orders ED  Discharge Orders          Ordered    doxycycline (VIBRAMYCIN) 100 MG capsule  2 times daily        08/23/21 1447    metroNIDAZOLE (FLAGYL) 500 MG tablet  2 times daily        08/23/21 1447    HYDROcodone-acetaminophen (NORCO/VICODIN) 5-325 MG tablet  Every 6 hours PRN        08/23/21 1447              Fayrene Helperran, Hoby Kawai, PA-C 08/23/21 1450    Horton, Clabe SealKristie M, DO 08/24/21 0720

## 2021-08-23 NOTE — ED Notes (Signed)
Animal control contacted by Pt

## 2021-08-23 NOTE — ED Triage Notes (Addendum)
Presents for dog bite to R arm, bleeding controlled, adipose tissue exposed from wound. L thumb wound as well. Distal motor and sensation intact. R radial pulse 2+. Her child's dog bit her, no utd rabies, unknown pt tetanus. Fell in between two dogs while they were fighting. Denies any other injury.  Is supposed to have gastric bypass in 2 weeks, would like this inconsideration when giving meds.

## 2021-08-23 NOTE — ED Notes (Signed)
Patient given discharge instructions. Questions were answered. Patient verbalized understanding of discharge instructions and care at home.  Discharged with family 

## 2021-08-23 NOTE — ED Notes (Signed)
[place] animal control contacted at this time.

## 2021-08-24 ENCOUNTER — Ambulatory Visit (HOSPITAL_COMMUNITY)
Admission: RE | Admit: 2021-08-24 | Discharge: 2021-08-24 | Disposition: A | Discharge status: Home / Self Care | Payer: No Typology Code available for payment source | Source: Ambulatory Visit | Attending: Orthopedic Surgery | Admitting: Orthopedic Surgery

## 2021-08-24 ENCOUNTER — Other Ambulatory Visit: Payer: Self-pay

## 2021-08-24 ENCOUNTER — Ambulatory Visit (HOSPITAL_COMMUNITY): Payer: No Typology Code available for payment source | Admitting: Anesthesiology

## 2021-08-24 ENCOUNTER — Encounter (HOSPITAL_COMMUNITY): Payer: Self-pay | Admitting: Orthopedic Surgery

## 2021-08-24 ENCOUNTER — Ambulatory Visit (HOSPITAL_BASED_OUTPATIENT_CLINIC_OR_DEPARTMENT_OTHER): Payer: No Typology Code available for payment source | Admitting: Anesthesiology

## 2021-08-24 ENCOUNTER — Encounter (HOSPITAL_COMMUNITY)
Admission: RE | Disposition: A | Discharge status: Home / Self Care | Payer: Self-pay | Source: Ambulatory Visit | Attending: Orthopedic Surgery

## 2021-08-24 DIAGNOSIS — I10 Essential (primary) hypertension: Secondary | ICD-10-CM

## 2021-08-24 DIAGNOSIS — F419 Anxiety disorder, unspecified: Secondary | ICD-10-CM

## 2021-08-24 DIAGNOSIS — Z6841 Body Mass Index (BMI) 40.0 and over, adult: Secondary | ICD-10-CM | POA: Diagnosis not present

## 2021-08-24 DIAGNOSIS — S51851A Open bite of right forearm, initial encounter: Secondary | ICD-10-CM

## 2021-08-24 DIAGNOSIS — W540XXA Bitten by dog, initial encounter: Secondary | ICD-10-CM | POA: Diagnosis not present

## 2021-08-24 HISTORY — PX: WOUND EXPLORATION: SHX6188

## 2021-08-24 HISTORY — DX: Essential (primary) hypertension: I10

## 2021-08-24 LAB — CBC
HCT: 40.3 % (ref 36.0–46.0)
Hemoglobin: 12.8 g/dL (ref 12.0–15.0)
MCH: 26.4 pg (ref 26.0–34.0)
MCHC: 31.8 g/dL (ref 30.0–36.0)
MCV: 83.3 fL (ref 80.0–100.0)
Platelets: 292 10*3/uL (ref 150–400)
RBC: 4.84 MIL/uL (ref 3.87–5.11)
RDW: 15.1 % (ref 11.5–15.5)
WBC: 8.9 10*3/uL (ref 4.0–10.5)
nRBC: 0 % (ref 0.0–0.2)

## 2021-08-24 LAB — BASIC METABOLIC PANEL
Anion gap: 12 (ref 5–15)
BUN: 14 mg/dL (ref 6–20)
CO2: 22 mmol/L (ref 22–32)
Calcium: 9.2 mg/dL (ref 8.9–10.3)
Chloride: 102 mmol/L (ref 98–111)
Creatinine, Ser: 1.1 mg/dL — ABNORMAL HIGH (ref 0.44–1.00)
GFR, Estimated: >60 mL/min (ref >60)
Glucose, Bld: 90 mg/dL (ref 70–99)
Potassium: 4.1 mmol/L (ref 3.5–5.1)
Sodium: 136 mmol/L (ref 135–145)

## 2021-08-24 SURGERY — WOUND EXPLORATION
Anesthesia: Monitor Anesthesia Care | Laterality: Right

## 2021-08-24 MED ORDER — BUPIVACAINE-EPINEPHRINE (PF) 0.25% -1:200000 IJ SOLN
INTRAMUSCULAR | Status: AC
  Filled 2021-08-24: qty 30

## 2021-08-24 MED ORDER — ORAL CARE MOUTH RINSE: 15.0000 mL | Freq: Once | OROMUCOSAL | Status: AC

## 2021-08-24 MED ORDER — FENTANYL CITRATE (PF) 250 MCG/5ML IJ SOLN
INTRAMUSCULAR | Status: AC
  Filled 2021-08-24: qty 5

## 2021-08-24 MED ORDER — 0.9 % SODIUM CHLORIDE (POUR BTL) OPTIME
TOPICAL | Status: DC | PRN
  Administered 2021-08-24: 1000 mL

## 2021-08-24 MED ORDER — FENTANYL CITRATE (PF) 100 MCG/2ML IJ SOLN
INTRAMUSCULAR | Status: AC
  Administered 2021-08-24: 100 ug via INTRAVENOUS
  Filled 2021-08-24: qty 2

## 2021-08-24 MED ORDER — MIDAZOLAM HCL 2 MG/2ML IJ SOLN
INTRAMUSCULAR | Status: AC
  Administered 2021-08-24: 2 mg via INTRAVENOUS
  Filled 2021-08-24: qty 2

## 2021-08-24 MED ORDER — OXYCODONE HCL 5 MG/5ML PO SOLN: 5.0000 mg | Freq: Once | ORAL | Status: DC | PRN

## 2021-08-24 MED ORDER — LACTATED RINGERS IV SOLN: INTRAVENOUS | Status: DC

## 2021-08-24 MED ORDER — CHLORHEXIDINE GLUCONATE 0.12 % MT SOLN: 15.0000 mL | Freq: Once | OROMUCOSAL | Status: AC

## 2021-08-24 MED ORDER — MEPERIDINE HCL 25 MG/ML IJ SOLN: 6.2500 mg | INTRAMUSCULAR | Status: DC | PRN

## 2021-08-24 MED ORDER — DEXAMETHASONE SODIUM PHOSPHATE 10 MG/ML IJ SOLN
INTRAMUSCULAR | Status: DC | PRN
  Administered 2021-08-24: 10 mg

## 2021-08-24 MED ORDER — HYDROMORPHONE HCL 1 MG/ML IJ SOLN: 0.2500 mg | INTRAMUSCULAR | Status: DC | PRN

## 2021-08-24 MED ORDER — MIDAZOLAM HCL 2 MG/2ML IJ SOLN: 2.0000 mg | Freq: Once | INTRAMUSCULAR | Status: AC

## 2021-08-24 MED ORDER — AMISULPRIDE (ANTIEMETIC) 5 MG/2ML IV SOLN: 10.0000 mg | Freq: Once | INTRAVENOUS | Status: DC | PRN

## 2021-08-24 MED ORDER — OXYCODONE HCL 5 MG PO TABS: 5.0000 mg | ORAL_TABLET | Freq: Once | ORAL | Status: DC | PRN

## 2021-08-24 MED ORDER — KETOROLAC TROMETHAMINE 30 MG/ML IJ SOLN: 30.0000 mg | Freq: Once | INTRAMUSCULAR | Status: DC | PRN

## 2021-08-24 MED ORDER — BUPIVACAINE HCL 0.5 % IJ SOLN
INTRAMUSCULAR | Status: AC
  Filled 2021-08-24: qty 1

## 2021-08-24 MED ORDER — ROPIVACAINE HCL 5 MG/ML IJ SOLN
INTRAMUSCULAR | Status: DC | PRN
  Administered 2021-08-24: 25 mL via PERINEURAL

## 2021-08-24 MED ORDER — CHLORHEXIDINE GLUCONATE 0.12 % MT SOLN
OROMUCOSAL | Status: AC
  Administered 2021-08-24: 15 mL via OROMUCOSAL
  Filled 2021-08-24: qty 15

## 2021-08-24 MED ORDER — CLINDAMYCIN PHOSPHATE 600 MG/50ML IV SOLN
600.0000 mg | INTRAVENOUS | Status: AC
  Administered 2021-08-24: 600 mg via INTRAVENOUS

## 2021-08-24 MED ORDER — PROMETHAZINE HCL 25 MG/ML IJ SOLN: 6.2500 mg | INTRAMUSCULAR | Status: DC | PRN

## 2021-08-24 MED ORDER — PROPOFOL 500 MG/50ML IV EMUL
INTRAVENOUS | Status: DC | PRN
  Administered 2021-08-24: 100 ug/kg/min via INTRAVENOUS

## 2021-08-24 MED ORDER — SODIUM CHLORIDE 0.9 % IR SOLN
Status: DC | PRN
Start: 2021-08-24 — End: 2021-08-24
  Administered 2021-08-24: 3000 mL

## 2021-08-24 MED ORDER — CLINDAMYCIN PHOSPHATE 600 MG/50ML IV SOLN
INTRAVENOUS | Status: AC
  Filled 2021-08-24: qty 50

## 2021-08-24 MED ORDER — FENTANYL CITRATE (PF) 100 MCG/2ML IJ SOLN: 100.0000 ug | Freq: Once | INTRAMUSCULAR | Status: AC

## 2021-08-24 SURGICAL SUPPLY — 33 items
BAG COUNTER SPONGE SURGICOUNT (BAG) ×2 IMPLANT
BNDG ELASTIC 3X5.8 VLCR STR LF (GAUZE/BANDAGES/DRESSINGS) ×2 IMPLANT
BNDG ELASTIC 4X5.8 VLCR STR LF (GAUZE/BANDAGES/DRESSINGS) ×2 IMPLANT
BNDG GAUZE ELAST 4 BULKY (GAUZE/BANDAGES/DRESSINGS) ×3 IMPLANT
CORD BIPOLAR FORCEPS 12FT (ELECTRODE) ×1 IMPLANT
CUFF TOURN SGL QUICK 18X4 (TOURNIQUET CUFF) ×2 IMPLANT
DRAPE SURG 17X11 SM STRL (DRAPES) ×4 IMPLANT
DRSG EMULSION OIL 3X3 NADH (GAUZE/BANDAGES/DRESSINGS) ×2 IMPLANT
DRSG PAD ABDOMINAL 8X10 ST (GAUZE/BANDAGES/DRESSINGS) ×2 IMPLANT
ELECT REM PT RETURN 9FT ADLT (ELECTROSURGICAL) ×2
ELECTRODE REM PT RTRN 9FT ADLT (ELECTROSURGICAL) ×1 IMPLANT
GAUZE 4X4 16PLY ~~LOC~~+RFID DBL (SPONGE) ×1 IMPLANT
GAUZE SPONGE 4X4 12PLY STRL (GAUZE/BANDAGES/DRESSINGS) ×2 IMPLANT
GAUZE XEROFORM 5X9 LF (GAUZE/BANDAGES/DRESSINGS) ×1 IMPLANT
GLOVE SURG ORTHO LTX SZ8 (GLOVE) ×2 IMPLANT
GOWN STRL REUS W/ TWL XL LVL3 (GOWN DISPOSABLE) ×1 IMPLANT
GOWN STRL REUS W/TWL XL LVL3 (GOWN DISPOSABLE) ×2
IV NS IRRIG 3000ML ARTHROMATIC (IV SOLUTION) ×2 IMPLANT
KIT BASIN OR (CUSTOM PROCEDURE TRAY) ×2 IMPLANT
MANIFOLD NEPTUNE II (INSTRUMENTS) ×2 IMPLANT
PACK ORTHO EXTREMITY (CUSTOM PROCEDURE TRAY) ×2 IMPLANT
PAD CAST 4YDX4 CTTN HI CHSV (CAST SUPPLIES) ×1 IMPLANT
PADDING CAST COTTON 4X4 STRL (CAST SUPPLIES) ×2
SET CYSTO W/LG BORE CLAMP LF (SET/KITS/TRAYS/PACK) ×2 IMPLANT
SOAP 2 % CHG 4 OZ (WOUND CARE) ×2 IMPLANT
SUT PROLENE 3 0 PS 2 (SUTURE) ×2 IMPLANT
SUT VIC AB 1 CT1 27 (SUTURE)
SUT VIC AB 1 CT1 27XBRD ANTBC (SUTURE) IMPLANT
SUT VIC AB 2-0 CT1 27 (SUTURE)
SUT VIC AB 2-0 CT1 27XBRD (SUTURE) IMPLANT
SYR 20ML LL LF (SYRINGE) ×2 IMPLANT
SYR CONTROL 10ML LL (SYRINGE) ×2 IMPLANT
TOWEL GREEN STERILE (TOWEL DISPOSABLE) ×2 IMPLANT

## 2021-08-24 NOTE — Anesthesia Preprocedure Evaluation (Addendum)
Anesthesia Evaluation  Patient identified by MRN, date of birth, ID band Patient awake    Reviewed: Allergy & Precautions, NPO status , Patient's Chart, lab work & pertinent test results  Airway Mallampati: II  TM Distance: >3 FB Neck ROM: Full    Dental  (+) Teeth Intact, Dental Advisory Given   Pulmonary neg pulmonary ROS,    Pulmonary exam normal breath sounds clear to auscultation       Cardiovascular hypertension (144/84 in preop), Pt. on medications Normal cardiovascular exam Rhythm:Regular Rate:Normal     Neuro/Psych PSYCHIATRIC DISORDERS Anxiety    GI/Hepatic negative GI ROS, Neg liver ROS,   Endo/Other  Morbid obesityBMI 46  Renal/GU negative Renal ROS  negative genitourinary   Musculoskeletal negative musculoskeletal ROS (+)   Abdominal (+) + obese,   Peds negative pediatric ROS (+)  Hematology negative hematology ROS (+)   Anesthesia Other Findings   Reproductive/Obstetrics negative OB ROS                            Anesthesia Physical Anesthesia Plan  ASA: 3  Anesthesia Plan: MAC and Regional   Post-op Pain Management: Regional block, Ofirmev IV (intra-op) and Toradol IV (intra-op)   Induction:   PONV Risk Score and Plan: 2 and Propofol infusion and TIVA  Airway Management Planned: Natural Airway and Simple Face Mask  Additional Equipment: None  Intra-op Plan:   Post-operative Plan:   Informed Consent: I have reviewed the patients History and Physical, chart, labs and discussed the procedure including the risks, benefits and alternatives for the proposed anesthesia with the patient or authorized representative who has indicated his/her understanding and acceptance.       Plan Discussed with: CRNA  Anesthesia Plan Comments:         Anesthesia Quick Evaluation

## 2021-08-24 NOTE — Op Note (Signed)
PREOPERATIVE DIAGNOSIS: Right forearm dog bite with large open wound  POSTOPERATIVE DIAGNOSIS: Same  ATTENDING SURGEON: Attending physician Dr. Iran Planas who scrubbed and present for the entire procedure  ASSISTANT SURGEON: None  ANESTHESIA: Regional with IV sedation  OPERATIVE PROCEDURE: Debridement of skin subcutaneous tissue and fascia right arm less than 20 cm excisional debridement Traumatic wound laceration repair 8 cm complex wound  IMPLANTS: None  EBL: Minimal  RADIOGRAPHIC INTERPRETATION: None  SURGICAL INDICATIONS: Patient is a right-hand-dominant female who sustained the significant injury to the right forearm from a dog patient seen evaluate in the office and recommended undergo the above procedure.  Risks of surgery include but not limited to bleeding infection damage nearby nerves arteries or tendons loss of motion of the wrist and digits incomplete relief of symptoms and need for further surgical invention.  Signed informed consent was obtained the day of surgery.  SURGICAL TECHNIQUE: Patient was palpated find the preoperative holding area marked apart a marker made on the right forearm indicate correct operative site.  Patient brought back to operating placed supine on the anesthesia table where the regional anesthetic had been administered.  Patient tolerated this well.  Well-padded tourniquet placed on the right brachium and stay with the appropriate drape.  The right upper extremities then prepped and draped normal sterile fashion.  A timeout was called the correct site was identified the procedure then begun.  Attention then turned to the right forearm.  Debridement type: Excisional Debridement  Side: right  Body Location: Right forearm   Tools used for debridement: scalpel and scissors  Pre-debridement Wound size (cm):   Length: 8        Width: 4     Depth: 3   Post-debridement Wound size (cm):   Length: 8        Width: 4.5     Depth: 3   Debridement depth  beyond dead/damaged tissue down to healthy viable tissue: yes  Tissue layer involved: skin, subcutaneous tissue, muscle / fascia  Nature of tissue removed: Necrotic and Devitalized Tissue  Irrigation volume: 3 L     Irrigation fluid type: Normal Saline  Patient tolerated debridement.  Following this the traumatic laceration was then repaired.  This was a complex traumatic laceration that was closed with 3-0 Prolene suture.  Following this was not closed tightly.  The wound was allowed to drain.  The patient also had a several centimeters laceration distal to the large open wound and this was loosely reapproximated with 3-0 Prolene suture.  Xeroform dressing sterile compressive bandage then applied.  The patient tolerated the procedure well patient tolerated the procedure well    POSTOPERATIVE PLAN: Patient discharged home.  See him back in the office in 4 days for wound check apply new bandage to Xeroform to bandage see her back at the around the 12 to 14-day mark for likely suture removal.  No radiographs the first visit.

## 2021-08-24 NOTE — H&P (Signed)
° °  Robin Lawson is an 51 y.o. female.   Chief Complaint: RIGHT ARM WOUND  HPI: the patient is a 51 year old right-hand dominant female who was bit by a dog on 08/23/21 because open wounds to the right forearm.  Initial treatment with bandage and antibiotics was completed. She was seen in our office today.  She continues to have pain and swelling.  Discussed the reason and rationale for surgical intervention. She is here today for surgery. She denies chest pain, shortness of breath, fever, chills, nausea, vomiting, diarrhea.  Past Medical History:  Diagnosis Date   Abnormal EKG    Anxiety    Borderline hypertension    Chronic knee pain    Dyspnea    Hypercholesteremia     No past surgical history on file.  No family history on file. Social History:  reports that she has never smoked. She has never used smokeless tobacco. She reports current alcohol use. She reports that she does not use drugs.  Allergies:  Allergies  Allergen Reactions   Penicillins     No medications prior to admission.    No results found for this or any previous visit (from the past 48 hour(s)). No results found.  ROS NO RECENT ILLNESSES OR HOSPITALIZATIONS  There were no vitals taken for this visit. Physical Exam  General Appearance:  Alert, cooperative, no distress, appears stated age  Head:  Normocephalic, without obvious abnormality, atraumatic  Eyes:  Pupils equal, conjunctiva/corneas clear,         Throat: Lips, mucosa, and tongue normal; teeth and gums normal  Neck: No visible masses     Lungs:   respirations unlabored  Chest Wall:  No tenderness or deformity  Heart:  Regular rate and rhythm,  Abdomen:   Soft, non-tender,         Extremities: RIGHT ARM: BANDAGE IN PLACE, FINGERS WARM WELL PERFUSED ABLE TO EXTEND THUMB AND DIGITS  Pulses: 2+ and symmetric  Skin: Skin color, texture, turgor normal, no rashes or lesions     Neurologic: Normal     Assessment/Plan RIGHT FOREARM  COMPLEX LACERATION/DOG BITE    - RIGHT FOREARM WOUND EXPLORATION AND REPAIR AS INDICATED  R/B/A DISCUSSED WITH PT IN OFFICE.  PT VOICED UNDERSTANDING OF PLAN CONSENT SIGNED DAY OF SURGERY PT SEEN AND EXAMINED PRIOR TO OPERATIVE PROCEDURE/DAY OF SURGERY SITE MARKED. QUESTIONS ANSWERED   WILL GO HOME FOLLOWING SURGERY  WE ARE PLANNING SURGERY FOR YOUR UPPER EXTREMITY. THE RISKS AND BENEFITS OF SURGERY INCLUDE BUT NOT LIMITED TO BLEEDING INFECTION, DAMAGE TO NEARBY NERVES ARTERIES TENDONS, FAILURE OF SURGERY TO ACCOMPLISH ITS INTENDED GOALS, PERSISTENT SYMPTOMS AND NEED FOR FURTHER SURGICAL INTERVENTION. WITH THIS IN MIND WE WILL PROCEED. I HAVE DISCUSSED WITH THE PATIENT THE PRE AND POSTOPERATIVE REGIMEN AND THE DOS AND DON'TS. PT VOICED UNDERSTANDING AND INFORMED CONSENT SIGNED.   Robin Planas MD  Robin Lawson 08/24/2021, 2:10 PM

## 2021-08-24 NOTE — Anesthesia Procedure Notes (Signed)
Procedure Name: MAC Date/Time: 08/24/2021 5:45 PM Performed by: Griffin Dakin, CRNA Pre-anesthesia Checklist: Patient identified, Emergency Drugs available, Suction available, Patient being monitored and Timeout performed Patient Re-evaluated:Patient Re-evaluated prior to induction Oxygen Delivery Method: Simple face mask Preoxygenation: Pre-oxygenation with 100% oxygen Induction Type: IV induction Placement Confirmation: positive ETCO2 and breath sounds checked- equal and bilateral Dental Injury: Teeth and Oropharynx as per pre-operative assessment

## 2021-08-24 NOTE — Anesthesia Procedure Notes (Signed)
Anesthesia Regional Block: Supraclavicular block   Pre-Anesthetic Checklist: , timeout performed,  Correct Patient, Correct Site, Correct Laterality,  Correct Procedure, Correct Position, site marked,  Risks and benefits discussed,  Surgical consent,  Pre-op evaluation,  At surgeon's request and post-op pain management  Laterality: Right  Prep: Maximum Sterile Barrier Precautions used, chloraprep       Needles:  Injection technique: Single-shot  Needle Type: Echogenic Stimulator Needle     Needle Length: 9cm  Needle Gauge: 22     Additional Needles:   Procedures:,,,, ultrasound used (permanent image in chart),,    Narrative:  Start time: 08/24/2021 5:15 PM End time: 08/24/2021 5:20 PM Injection made incrementally with aspirations every 5 mL.  Performed by: Personally  Anesthesiologist: Lannie Fields, DO  Additional Notes: Monitors applied. No increased pain on injection. No increased resistance to injection. Injection made in 5cc increments. Good needle visualization. Patient tolerated procedure well.

## 2021-08-24 NOTE — Anesthesia Postprocedure Evaluation (Signed)
Anesthesia Post Note  Patient: Robin Lawson  Procedure(s) Performed: Right forearm wound exploration and repair as indicated (Right)     Patient location during evaluation: PACU Anesthesia Type: Regional and MAC Level of consciousness: awake and alert Pain management: pain level controlled Vital Signs Assessment: post-procedure vital signs reviewed and stable Respiratory status: spontaneous breathing, nonlabored ventilation, respiratory function stable and patient connected to nasal cannula oxygen Cardiovascular status: stable and blood pressure returned to baseline Postop Assessment: no apparent nausea or vomiting Anesthetic complications: no   No notable events documented.  Last Vitals:  Vitals:   08/24/21 1835 08/24/21 1850  BP: (!) 137/91 139/75  Pulse: 72 67  Resp: 14 11  Temp:  36.6 C  SpO2: 97% 99%    Last Pain:  Vitals:   08/24/21 1850  TempSrc:   PainSc: 0-No pain                 Belenda Cruise P Leala Bryand

## 2021-08-24 NOTE — Discharge Instructions (Signed)
KEEP BANDAGE CLEAN AND DRY °CALL OFFICE FOR F/U APPT [phone] IN 4 DAYS °KEEP HAND ELEVATED ABOVE HEART °OK TO APPLY ICE TO OPERATIVE AREA °CONTACT OFFICE IF ANY WORSENING PAIN OR CONCERNS. °

## 2021-08-24 NOTE — Transfer of Care (Signed)
Immediate Anesthesia Transfer of Care Note  Patient: Robin Lawson  Procedure(s) Performed: Right forearm wound exploration and repair as indicated (Right)  Patient Location: PACU  Anesthesia Type:MAC and Regional  Level of Consciousness: awake, alert  and oriented  Airway & Oxygen Therapy: Patient Spontanous Breathing  Post-op Assessment: Report given to RN and Post -op Vital signs reviewed and stable  Post vital signs: Reviewed and stable  Last Vitals:  Vitals Value Taken Time  BP 120/74 08/24/21 1820  Temp 36.6 C 08/24/21 1819  Pulse 63 08/24/21 1828  Resp 11 08/24/21 1828  SpO2 99 % 08/24/21 1828  Vitals shown include unvalidated device data.  Last Pain:  Vitals:   08/24/21 1725  TempSrc:   PainSc: 0-No pain         Complications: No notable events documented.

## 2021-08-25 ENCOUNTER — Encounter (HOSPITAL_COMMUNITY): Payer: Self-pay | Admitting: Orthopedic Surgery

## 2023-10-01 ENCOUNTER — Other Ambulatory Visit: Payer: Self-pay | Admitting: Obstetrics and Gynecology

## 2023-10-01 DIAGNOSIS — R928 Other abnormal and inconclusive findings on diagnostic imaging of breast: Secondary | ICD-10-CM

## 2023-10-08 ENCOUNTER — Ambulatory Visit
Admission: RE | Admit: 2023-10-08 | Discharge: 2023-10-08 | Disposition: A | Discharge status: Home / Self Care | Source: Ambulatory Visit | Attending: Obstetrics and Gynecology | Admitting: Obstetrics and Gynecology

## 2023-10-08 ENCOUNTER — Ambulatory Visit

## 2023-10-08 DIAGNOSIS — R928 Other abnormal and inconclusive findings on diagnostic imaging of breast: Secondary | ICD-10-CM

## 2023-10-16 ENCOUNTER — Other Ambulatory Visit

## 2023-10-16 ENCOUNTER — Encounter
